# Patient Record
Sex: Male | Born: 1985 | Race: White | Hispanic: No | Marital: Single | Smoking: Never smoker
Health system: Southern US, Community
[De-identification: ages and names within clinical notes are randomized; demographics above are authoritative.]

## PROBLEM LIST (undated history)

## (undated) DIAGNOSIS — K08409 Partial loss of teeth, unspecified cause, unspecified class: Secondary | ICD-10-CM

## (undated) HISTORY — PX: FRACTURE SURGERY: SHX138

## (undated) HISTORY — PX: HERNIA REPAIR: SHX51

---

## 2016-10-02 ENCOUNTER — Ambulatory Visit: Payer: Self-pay | Admitting: General Surgery

## 2016-10-04 ENCOUNTER — Ambulatory Visit: Payer: Self-pay | Admitting: General Surgery

## 2016-10-04 NOTE — H&P (Signed)
History of Present Illness Axel Filler MD; 10/01/2016 11:17 AM) The patient is a 31 year old male who presents with an inguinal hernia. Referred by: Dr. Tarri Fuller Chief Complaint: Right inguinal pain  Patient is a 31 year old male who comes in with a several month history of right inguinal pain. He states that he has sporadic pain that can be sharp at times. He does state that he is very active. He does state that at times when he is exerting himself he does have some pain to the right inguinal area. He has not noticed any bulge. He does do some heavy lifting at times. He's had no signs or symptoms of incarceration or strangulation. He states his most recent episode he is lying down did not help with his discomfort.    Past Surgical History (Tanisha A. Manson Passey, RMA; 10/01/2016 11:00 AM) No pertinent past surgical history   Diagnostic Studies History (Tanisha A. Manson Passey, RMA; 10/01/2016 11:00 AM) Colonoscopy  never  Allergies (Tanisha A. Manson Passey, RMA; 10/01/2016 11:01 AM) No Known Drug Allergies 10/01/2016 Allergies Reconciled   Medication History (Tanisha A. Manson Passey, RMA; 10/01/2016 11:01 AM) Amphetamine-Dextroamphetamine (  Tablet, Oral) Active. Medications Reconciled  Social History (Tanisha A. Manson Passey, RMA; 10/01/2016 11:00 AM) Caffeine use  Carbonated beverages, Tea. No drug use     Review of Systems Axel Filler MD; 10/01/2016 11:16 AM) General Not Present- Appetite Loss, Chills, Fatigue, Fever, Night Sweats, Weight Gain and Weight Loss. Skin Not Present- Change in Wart/Mole, Dryness, Hives, Jaundice, New Lesions, Non-Healing Wounds, Rash and Ulcer. Respiratory Not Present- Bloody sputum, Chronic Cough, Difficulty Breathing, Snoring and Wheezing. Breast Not Present- Breast Mass, Breast Pain, Nipple Discharge and Skin Changes. Cardiovascular Not Present- Chest Pain, Difficulty Breathing Lying Down, Leg Cramps, Palpitations, Rapid Heart Rate, Shortness of Breath  and Swelling of Extremities. Gastrointestinal Not Present- Abdominal Pain, Bloating, Bloody Stool, Change in Bowel Habits, Chronic diarrhea, Constipation, Difficulty Swallowing, Excessive gas, Gets full quickly at meals, Hemorrhoids, Indigestion, Nausea, Rectal Pain and Vomiting. Male Genitourinary Not Present- Blood in Urine, Change in Urinary Stream, Frequency, Impotence, Nocturia, Painful Urination, Urgency and Urine Leakage. Neurological Not Present- Decreased Memory, Fainting, Headaches, Numbness, Seizures, Tingling, Tremor, Trouble walking and Weakness. Psychiatric Not Present- Anxiety, Bipolar, Change in Sleep Pattern, Depression, Fearful and Frequent crying. Endocrine Not Present- Cold Intolerance, Excessive Hunger, Hair Changes, Heat Intolerance, Hot flashes and New Diabetes. Hematology Not Present- Blood Thinners, Easy Bruising, Excessive bleeding, Gland problems, HIV and Persistent Infections. All other systems negative  Vitals (Tanisha A. Brown RMA; 10/01/2016 11:01 AM) 10/01/2016 11:00 AM Weight: 186.8 lb Height: 71in Body Surface Area: 2.05 m Body Mass Index: 26.05 kg/m  Temp.: 97.33F  Pulse: 64 (Regular)  P.OX: 92% (Room air) BP: 122/76 (Sitting, Left Arm, Standard)       Physical Exam Axel Filler MD; 10/01/2016 11:17 AM) The physical exam findings are as follows: Note:Constitutional: No acute distress, conversant, appears stated age  Eyes: Anicteric sclerae, moist conjunctiva, no lid lag  Neck: No thyromegaly, trachea midline, no cervical lymphadenopathy  Lungs: Clear to auscultation biilaterally, normal respiratory effot  Cardiovascular: regular rate & rhythm, no murmurs, no peripheal edema, pedal pulses 2+  GI: Soft, no masses or hepatosplenomegaly, non-tender to palpation  MSK: Normal gait, no clubbing cyanosis, edema  Skin: No rashes, palpation reveals normal skin turgor  Psychiatric: Appropriate judgment and insight, oriented to person,  place, and time  Abdomen Inspection Hernias - Inguinal hernia - Right - Reducible.    Assessment & Plan Jed Limerick Derrell Lolling  MD; 10/01/2016 11:17 AM) RIGHT INGUINAL HERNIA (K40.90) Impression: 31 year old male with a right inguinal hernia  1. The patient will like to proceed to the operating room for laparoscopic right inguinal hernia repair with mesh.  2. I discussed with the patient the signs and symptoms of incarceration and strangulation and the need to proceed to the ER should they occur.  3. I discussed with the patient the risks and benefits of the procedure to include but not limited to: Infection, bleeding, damage to surrounding structures, possible need for further surgery, possible nerve pain, and possible recurrence. The patient was understanding and wishes to proceed.

## 2016-10-22 NOTE — Patient Instructions (Addendum)
Gerard Cantara  10/22/2016   Your procedure is scheduled on: 11-02-16  Report to [facility] Main  Entrance Take [place]  elevators to 3rd floor to  Short Stay Center at 10:45 AM.   Call this number if you have problems the morning of surgery [phone]   Remember: ONLY 1 PERSON MAY GO WITH YOU TO SHORT STAY TO GET  READY MORNING OF YOUR SURGERY.  Do not eat food or drink liquids :After Midnight.     Take these medicines the morning of surgery with A SIP OF WATER: None                                You may not have any metal on your body including hair pins and              piercings  Do not wear jewelry, lotions, powders or perfumes, deodorant             Men may shave face and neck.   Do not bring valuables to the hospital. [facility] IS NOT             RESPONSIBLE   FOR VALUABLES.  Contacts, dentures or bridgework may not be worn into surgery.     Patients discharged the day of surgery will not be allowed to drive home.  Name and phone number of your driver: Lequita Halt [phone]               Please read over the following fact sheets you were given: _____________________________________________________________________             [facility] - Preparing for Surgery Before surgery, you can play an important role.  Because skin is not sterile, your skin needs to be as free of germs as possible.  You can reduce the number of germs on your skin by washing with CHG (chlorahexidine gluconate) soap before surgery.  CHG is an antiseptic cleaner which kills germs and bonds with the skin to continue killing germs even after washing. Please DO NOT use if you have an allergy to CHG or antibacterial soaps.  If your skin becomes reddened/irritated stop using the CHG and inform your nurse when you arrive at Short Stay. Do not shave (including legs and underarms) for at least 48 hours prior to the first CHG shower.  You may shave your face/neck. Please follow these  instructions carefully:  1.  Shower with CHG Soap the night before surgery and the  morning of Surgery.  2.  If you choose to wash your hair, wash your hair first as usual with your  normal  shampoo.  3.  After you shampoo, rinse your hair and body thoroughly to remove the  shampoo.                           4.  Use CHG as you would any other liquid soap.  You can apply chg directly  to the skin and wash                       Gently with a scrungie or clean washcloth.  5.  Apply the CHG Soap to your body ONLY FROM THE NECK DOWN.   Do not use on face/ open  Wound or open sores. Avoid contact with eyes, ears mouth and genitals (private parts).                       Wash face,  Genitals (private parts) with your normal soap.             6.  Wash thoroughly, paying special attention to the area where your surgery  will be performed.  7.  Thoroughly rinse your body with warm water from the neck down.  8.  DO NOT shower/wash with your normal soap after using and rinsing off  the CHG Soap.                9.  Pat yourself dry with a clean towel.            10.  Wear clean pajamas.            11.  Place clean sheets on your bed the night of your first shower and do not  sleep with pets. Day of Surgery : Do not apply any lotions/deodorants the morning of surgery.  Please wear clean clothes to the hospital/surgery center.  FAILURE TO FOLLOW THESE INSTRUCTIONS MAY RESULT IN THE CANCELLATION OF YOUR SURGERY PATIENT SIGNATURE_________________________________  NURSE SIGNATURE__________________________________  ________________________________________________________________________

## 2016-10-23 ENCOUNTER — Encounter (HOSPITAL_COMMUNITY)
Admission: RE | Admit: 2016-10-23 | Discharge: 2016-10-23 | Disposition: A | Discharge status: Home / Self Care | Payer: PRIVATE HEALTH INSURANCE | Source: Ambulatory Visit | Attending: General Surgery | Admitting: General Surgery

## 2016-10-23 ENCOUNTER — Encounter (INDEPENDENT_AMBULATORY_CARE_PROVIDER_SITE_OTHER): Payer: Self-pay

## 2016-10-23 ENCOUNTER — Encounter (HOSPITAL_COMMUNITY): Payer: Self-pay

## 2016-10-23 DIAGNOSIS — K4090 Unilateral inguinal hernia, without obstruction or gangrene, not specified as recurrent: Secondary | ICD-10-CM | POA: Diagnosis not present

## 2016-10-23 DIAGNOSIS — Z01818 Encounter for other preprocedural examination: Secondary | ICD-10-CM | POA: Insufficient documentation

## 2016-10-23 HISTORY — DX: Partial loss of teeth, unspecified cause, unspecified class: K08.409

## 2016-10-23 LAB — CBC
HCT: 41.4 % (ref 39.0–52.0)
HEMOGLOBIN: 14.3 g/dL (ref 13.0–17.0)
MCH: 28.8 pg (ref 26.0–34.0)
MCHC: 34.5 g/dL (ref 30.0–36.0)
MCV: 83.5 fL (ref 78.0–100.0)
PLATELETS: 207 10*3/uL (ref 150–400)
RBC: 4.96 MIL/uL (ref 4.22–5.81)
RDW: 12.6 % (ref 11.5–15.5)
WBC: 5.7 10*3/uL (ref 4.0–10.5)

## 2016-11-02 ENCOUNTER — Ambulatory Visit (HOSPITAL_COMMUNITY): Payer: PRIVATE HEALTH INSURANCE | Admitting: Anesthesiology

## 2016-11-02 ENCOUNTER — Ambulatory Visit (HOSPITAL_COMMUNITY)
Admission: RE | Admit: 2016-11-02 | Discharge: 2016-11-02 | Disposition: A | Discharge status: Home / Self Care | Payer: PRIVATE HEALTH INSURANCE | Source: Ambulatory Visit | Attending: General Surgery | Admitting: General Surgery

## 2016-11-02 ENCOUNTER — Encounter (HOSPITAL_COMMUNITY): Payer: Self-pay | Admitting: *Deleted

## 2016-11-02 ENCOUNTER — Encounter (HOSPITAL_COMMUNITY)
Admission: RE | Disposition: A | Discharge status: Home / Self Care | Payer: Self-pay | Source: Ambulatory Visit | Attending: General Surgery

## 2016-11-02 DIAGNOSIS — K4090 Unilateral inguinal hernia, without obstruction or gangrene, not specified as recurrent: Secondary | ICD-10-CM | POA: Insufficient documentation

## 2016-11-02 HISTORY — PX: INGUINAL HERNIA REPAIR: SHX194

## 2016-11-02 SURGERY — REPAIR, HERNIA, INGUINAL, LAPAROSCOPIC
Anesthesia: General | Site: Groin | Laterality: Right

## 2016-11-02 MED ORDER — SODIUM CHLORIDE 0.9% FLUSH: 3.0000 mL | Freq: Two times a day (BID) | INTRAVENOUS | Status: DC

## 2016-11-02 MED ORDER — HYDROMORPHONE HCL 1 MG/ML IJ SOLN
INTRAMUSCULAR | Status: DC | PRN
  Administered 2016-11-02 (×2): 1 mg via INTRAVENOUS

## 2016-11-02 MED ORDER — LACTATED RINGERS IV SOLN
INTRAVENOUS | Status: DC
  Administered 2016-11-02 (×2): via INTRAVENOUS

## 2016-11-02 MED ORDER — PROPOFOL 10 MG/ML IV BOLUS
INTRAVENOUS | Status: AC
  Filled 2016-11-02: qty 20

## 2016-11-02 MED ORDER — SODIUM CHLORIDE 0.9 % IV SOLN: 250.0000 mL | INTRAVENOUS | Status: DC | PRN

## 2016-11-02 MED ORDER — OXYCODONE HCL 5 MG PO TABS
5.0000 mg | ORAL_TABLET | ORAL | Status: DC | PRN
  Administered 2016-11-02 (×2): 5 mg via ORAL
  Filled 2016-11-02 (×2): qty 1

## 2016-11-02 MED ORDER — LIDOCAINE 2% (20 MG/ML) 5 ML SYRINGE
INTRAMUSCULAR | Status: AC
  Filled 2016-11-02: qty 5

## 2016-11-02 MED ORDER — ONDANSETRON HCL 4 MG/2ML IJ SOLN
INTRAMUSCULAR | Status: DC | PRN
  Administered 2016-11-02: 4 mg via INTRAVENOUS

## 2016-11-02 MED ORDER — LIDOCAINE 2% (20 MG/ML) 5 ML SYRINGE
INTRAMUSCULAR | Status: DC | PRN
  Administered 2016-11-02: 100 mg via INTRAVENOUS

## 2016-11-02 MED ORDER — FENTANYL CITRATE (PF) 100 MCG/2ML IJ SOLN: 25.0000 ug | INTRAMUSCULAR | Status: DC | PRN

## 2016-11-02 MED ORDER — BUPIVACAINE-EPINEPHRINE 0.25% -1:200000 IJ SOLN
INTRAMUSCULAR | Status: DC | PRN
  Administered 2016-11-02: 7 mL

## 2016-11-02 MED ORDER — ONDANSETRON HCL 4 MG/2ML IJ SOLN
INTRAMUSCULAR | Status: AC
Start: 2016-11-02 — End: 2016-11-02
  Filled 2016-11-02: qty 2

## 2016-11-02 MED ORDER — BUPIVACAINE-EPINEPHRINE (PF) 0.25% -1:200000 IJ SOLN
INTRAMUSCULAR | Status: AC
  Filled 2016-11-02: qty 30

## 2016-11-02 MED ORDER — MORPHINE SULFATE (PF) 4 MG/ML IV SOLN: 2.0000 mg | INTRAVENOUS | Status: DC | PRN

## 2016-11-02 MED ORDER — FENTANYL CITRATE (PF) 100 MCG/2ML IJ SOLN
INTRAMUSCULAR | Status: DC | PRN
  Administered 2016-11-02 (×3): 50 ug via INTRAVENOUS

## 2016-11-02 MED ORDER — ROCURONIUM BROMIDE 50 MG/5ML IV SOSY
PREFILLED_SYRINGE | INTRAVENOUS | Status: AC
  Filled 2016-11-02: qty 5

## 2016-11-02 MED ORDER — SUGAMMADEX SODIUM 500 MG/5ML IV SOLN
INTRAVENOUS | Status: AC
  Filled 2016-11-02: qty 5

## 2016-11-02 MED ORDER — PROPOFOL 10 MG/ML IV BOLUS
INTRAVENOUS | Status: AC
Start: 2016-11-02 — End: 2016-11-02
  Filled 2016-11-02: qty 20

## 2016-11-02 MED ORDER — PROPOFOL 10 MG/ML IV BOLUS
INTRAVENOUS | Status: DC | PRN
  Administered 2016-11-02: 300 mg via INTRAVENOUS

## 2016-11-02 MED ORDER — CHLORHEXIDINE GLUCONATE CLOTH 2 % EX PADS: 6.0000 | MEDICATED_PAD | Freq: Once | CUTANEOUS | Status: DC

## 2016-11-02 MED ORDER — ROCURONIUM BROMIDE 50 MG/5ML IV SOSY
PREFILLED_SYRINGE | INTRAVENOUS | Status: DC | PRN
  Administered 2016-11-02: 50 mg via INTRAVENOUS
  Administered 2016-11-02: 20 mg via INTRAVENOUS

## 2016-11-02 MED ORDER — PROMETHAZINE HCL 25 MG/ML IJ SOLN: 6.2500 mg | INTRAMUSCULAR | Status: DC | PRN

## 2016-11-02 MED ORDER — 0.9 % SODIUM CHLORIDE (POUR BTL) OPTIME
TOPICAL | Status: DC | PRN
  Administered 2016-11-02: 1000 mL

## 2016-11-02 MED ORDER — FENTANYL CITRATE (PF) 250 MCG/5ML IJ SOLN
INTRAMUSCULAR | Status: AC
  Filled 2016-11-02: qty 5

## 2016-11-02 MED ORDER — ACETAMINOPHEN 325 MG PO TABS: 650.0000 mg | ORAL_TABLET | ORAL | Status: DC | PRN

## 2016-11-02 MED ORDER — DEXAMETHASONE SODIUM PHOSPHATE 10 MG/ML IJ SOLN
INTRAMUSCULAR | Status: AC
  Filled 2016-11-02: qty 1

## 2016-11-02 MED ORDER — MIDAZOLAM HCL 2 MG/2ML IJ SOLN
INTRAMUSCULAR | Status: AC
  Filled 2016-11-02: qty 2

## 2016-11-02 MED ORDER — SODIUM CHLORIDE 0.9% FLUSH: 3.0000 mL | INTRAVENOUS | Status: DC | PRN

## 2016-11-02 MED ORDER — HYDROMORPHONE HCL 2 MG/ML IJ SOLN
INTRAMUSCULAR | Status: AC
  Filled 2016-11-02: qty 1

## 2016-11-02 MED ORDER — ACETAMINOPHEN 650 MG RE SUPP
650.0000 mg | RECTAL | Status: DC | PRN
  Filled 2016-11-02: qty 1

## 2016-11-02 MED ORDER — DEXAMETHASONE SODIUM PHOSPHATE 4 MG/ML IJ SOLN
INTRAMUSCULAR | Status: DC | PRN
  Administered 2016-11-02: 10 mg via INTRAVENOUS

## 2016-11-02 MED ORDER — MIDAZOLAM HCL 5 MG/5ML IJ SOLN
INTRAMUSCULAR | Status: DC | PRN
  Administered 2016-11-02: 2 mg via INTRAVENOUS

## 2016-11-02 MED ORDER — SUGAMMADEX SODIUM 200 MG/2ML IV SOLN
INTRAVENOUS | Status: DC | PRN
  Administered 2016-11-02: 200 mg via INTRAVENOUS

## 2016-11-02 MED ORDER — CEFAZOLIN SODIUM-DEXTROSE 2-4 GM/100ML-% IV SOLN
2.0000 g | INTRAVENOUS | Status: AC
  Administered 2016-11-02: 2 g via INTRAVENOUS
  Filled 2016-11-02: qty 100

## 2016-11-02 MED ORDER — OXYCODONE-ACETAMINOPHEN 5-325 MG PO TABS: 1.0000 | ORAL_TABLET | Freq: Four times a day (QID) | ORAL | 0 refills | Status: AC | PRN

## 2016-11-02 SURGICAL SUPPLY — 33 items
APPLIER CLIP 5 13 M/L LIGAMAX5 (MISCELLANEOUS)
BENZOIN TINCTURE PRP APPL 2/3 (GAUZE/BANDAGES/DRESSINGS) ×3 IMPLANT
CABLE HIGH FREQUENCY MONO STRZ (ELECTRODE) ×3 IMPLANT
CHLORAPREP W/TINT 26ML (MISCELLANEOUS) ×3 IMPLANT
CLIP APPLIE 5 13 M/L LIGAMAX5 (MISCELLANEOUS) IMPLANT
CLOSURE WOUND 1/2 X4 (GAUZE/BANDAGES/DRESSINGS) ×1
DECANTER SPIKE VIAL GLASS SM (MISCELLANEOUS) ×3 IMPLANT
DERMABOND ADVANCED (GAUZE/BANDAGES/DRESSINGS) ×2
DERMABOND ADVANCED .7 DNX12 (GAUZE/BANDAGES/DRESSINGS) ×1 IMPLANT
ELECT REM PT RETURN 15FT ADLT (MISCELLANEOUS) ×3 IMPLANT
ENDOLOOP SUT PDS II  0 18 (SUTURE)
ENDOLOOP SUT PDS II 0 18 (SUTURE) IMPLANT
GAUZE SPONGE 2X2 8PLY STRL LF (GAUZE/BANDAGES/DRESSINGS) ×1 IMPLANT
GLOVE BIO SURGEON STRL SZ7.5 (GLOVE) ×3 IMPLANT
GOWN STRL REUS W/TWL XL LVL3 (GOWN DISPOSABLE) ×6 IMPLANT
KIT BASIN OR (CUSTOM PROCEDURE TRAY) ×3 IMPLANT
MESH 3DMAX 5X7 RT XLRG (Mesh General) ×3 IMPLANT
NEEDLE INSUFFLATION 14GA 120MM (NEEDLE) IMPLANT
POSITIONER SURGICAL ARM (MISCELLANEOUS) ×3 IMPLANT
RELOAD STAPLE HERNIA 4.0 BLUE (INSTRUMENTS) ×3 IMPLANT
RELOAD STAPLE HERNIA 4.8 BLK (STAPLE) IMPLANT
SCISSORS LAP 5X35 DISP (ENDOMECHANICALS) ×3 IMPLANT
SET IRRIG TUBING LAPAROSCOPIC (IRRIGATION / IRRIGATOR) ×3 IMPLANT
SPONGE GAUZE 2X2 STER 10/PKG (GAUZE/BANDAGES/DRESSINGS) ×2
STAPLER HERNIA 12 8.5 360D (INSTRUMENTS) ×3 IMPLANT
STRIP CLOSURE SKIN 1/2X4 (GAUZE/BANDAGES/DRESSINGS) ×2 IMPLANT
SUT MNCRL AB 4-0 PS2 18 (SUTURE) ×3 IMPLANT
TOWEL OR 17X26 10 PK STRL BLUE (TOWEL DISPOSABLE) ×3 IMPLANT
TOWEL OR NON WOVEN STRL DISP B (DISPOSABLE) ×3 IMPLANT
TRAY LAPAROSCOPIC (CUSTOM PROCEDURE TRAY) ×3 IMPLANT
TROCAR CANNULA W/PORT DUAL 5MM (MISCELLANEOUS) ×3 IMPLANT
TROCAR XCEL 12X100 BLDLESS (ENDOMECHANICALS) ×3 IMPLANT
TUBING INSUF HEATED (TUBING) ×3 IMPLANT

## 2016-11-02 NOTE — Op Note (Signed)
11/02/2016  1:32 PM  PATIENT:  Justin Cobb  31 y.o. male  PRE-OPERATIVE DIAGNOSIS:  RIGHT INGUINAL HERNIA  POST-OPERATIVE DIAGNOSIS:  RIGHT INDIRECT INGUINAL HERNIA  PROCEDURE:  Procedure(s): LAPAROSCOPIC RIGHT INGUINAL HERNIA WITH MESH (Right)  SURGEON:  Surgeon(s) and Role:    * Axel Filleramirez, Red Mandt, MD - Primary  ANESTHESIA:   local and general  EBL:  minimal   BLOOD ADMINISTERED:none  DRAINS: none   LOCAL MEDICATIONS USED:  BUPIVICAINE   SPECIMEN:  No Specimen  DISPOSITION OF SPECIMEN:  N/A  COUNTS:  YES  TOURNIQUET:  * No tourniquets in log *  DICTATION: .Dragon Dictation    Counts: reported as correct x 2  Findings:  The patient had a small right INdirect hernia  Indications for procedure:  The patient is a [age] year old male with a RIGHT hernia for several months. Patient complained of symptomatology to his RIGHT inguinal area. The patient was taken back for elective inguinal hernia repair.  Details of the procedure: The patient was taken back to the operating room. The patient was placed in supine position with bilateral SCDs in place.  The patient was prepped and draped in the usual sterile fashion.  After appropriate anitbiotics were confirmed, a time-out was confirmed and all facts were verified.  0.25% Marcaine was used to infiltrate the umbilical area. A 11-blade was used to cut down the skin and blunt dissection was used to get the anterior fashion.  The anterior fascia was incised approximately 1 cm and the muscles were retracted laterally. Blunt dissection was then used to create a space in the preperitoneal area. At this time a 10 mm camera was then introduced into the space and advanced the pubic tubercle and a 12 mm trocar was placed over this and insufflation was started.  At this time and space was created from medial to laterally the preperitoneal space.  Cooper's ligament was initially cleaned off.  The hernia sac was identified in the indirect space.  Dissection of the hernia sac was undertaken the vas deferens was identified and protected in all parts of the case.  There was a small tear into the hernia sac. A PDS endoloop was used to ligate the tear.  Once the hernia sac was taken down to approximately the umbilicus a Bard 3D Max mesh, size: Barney DrainXLarge, was  introduced into the preperitoneal space.  The mesh was brought over to cover the direct and indirect hernia spaces.  This was anchored into place and secured to Cooper's ligament with 4.490mm staples from a Coviden hernia stapler. It was anchored to the anterior abdominal wall with 4.8 mm staples. The hernia sac was seen lying posterior to the mesh. There was no staples placed laterally. The insufflation was evacuated and the peritoneum was seen posterior to the mesh. The trochars were removed. The anterior fascia was reapproximated using #1 Vicryl on a UR- 6.  Intra-abdominal air was evacuated and the Veress needle removed. The skin was reapproximated using 4-0 Monocryl subcuticular fashion the patient was awakened from general anesthesia and taken to recovery in stable condition.\   PLAN OF CARE: Discharge to home after PACU  PATIENT DISPOSITION:  PACU - hemodynamically stable.   Delay start of Pharmacological VTE agent (>24hrs) due to surgical blood loss or risk of bleeding: not applicable

## 2016-11-02 NOTE — H&P (View-Only) (Signed)
History of Present Illness Axel Filler(Elyshia Kumagai MD; 10/01/2016 11:17 AM) The patient is a 31 year old male who presents with an inguinal hernia. Referred by: Dr. Tarri Fullerichard Escajeda Chief Complaint: Right inguinal pain  Patient is a 31 year old male who comes in with a several month history of right inguinal pain. He states that he has sporadic pain that can be sharp at times. He does state that he is very active. He does state that at times when he is exerting himself he does have some pain to the right inguinal area. He has not noticed any bulge. He does do some heavy lifting at times. He's had no signs or symptoms of incarceration or strangulation. He states his most recent episode he is lying down did not help with his discomfort.    Past Surgical History (Tanisha A. Manson PasseyBrown, RMA; 10/01/2016 11:00 AM) No pertinent past surgical history   Diagnostic Studies History (Tanisha A. Manson PasseyBrown, RMA; 10/01/2016 11:00 AM) Colonoscopy  never  Allergies (Tanisha A. Manson PasseyBrown, RMA; 10/01/2016 11:01 AM) No Known Drug Allergies 10/01/2016 Allergies Reconciled   Medication History (Tanisha A. Manson PasseyBrown, RMA; 10/01/2016 11:01 AM) Amphetamine-Dextroamphetamine (10MG  Tablet, Oral) Active. Medications Reconciled  Social History (Tanisha A. Manson PasseyBrown, RMA; 10/01/2016 11:00 AM) Caffeine use  Carbonated beverages, Tea. No drug use     Review of Systems Axel Filler(Refael Fulop MD; 10/01/2016 11:16 AM) General Not Present- Appetite Loss, Chills, Fatigue, Fever, Night Sweats, Weight Gain and Weight Loss. Skin Not Present- Change in Wart/Mole, Dryness, Hives, Jaundice, New Lesions, Non-Healing Wounds, Rash and Ulcer. Respiratory Not Present- Bloody sputum, Chronic Cough, Difficulty Breathing, Snoring and Wheezing. Breast Not Present- Breast Mass, Breast Pain, Nipple Discharge and Skin Changes. Cardiovascular Not Present- Chest Pain, Difficulty Breathing Lying Down, Leg Cramps, Palpitations, Rapid Heart Rate, Shortness of Breath  and Swelling of Extremities. Gastrointestinal Not Present- Abdominal Pain, Bloating, Bloody Stool, Change in Bowel Habits, Chronic diarrhea, Constipation, Difficulty Swallowing, Excessive gas, Gets full quickly at meals, Hemorrhoids, Indigestion, Nausea, Rectal Pain and Vomiting. Male Genitourinary Not Present- Blood in Urine, Change in Urinary Stream, Frequency, Impotence, Nocturia, Painful Urination, Urgency and Urine Leakage. Neurological Not Present- Decreased Memory, Fainting, Headaches, Numbness, Seizures, Tingling, Tremor, Trouble walking and Weakness. Psychiatric Not Present- Anxiety, Bipolar, Change in Sleep Pattern, Depression, Fearful and Frequent crying. Endocrine Not Present- Cold Intolerance, Excessive Hunger, Hair Changes, Heat Intolerance, Hot flashes and New Diabetes. Hematology Not Present- Blood Thinners, Easy Bruising, Excessive bleeding, Gland problems, HIV and Persistent Infections. All other systems negative  Vitals (Tanisha A. Brown RMA; 10/01/2016 11:01 AM) 10/01/2016 11:00 AM Weight: 186.8 lb Height: 71in Body Surface Area: 2.05 m Body Mass Index: 26.05 kg/m  Temp.: 97.41F  Pulse: 64 (Regular)  P.OX: 92% (Room air) BP: 122/76 (Sitting, Left Arm, Standard)       Physical Exam Axel Filler(Fallen Crisostomo MD; 10/01/2016 11:17 AM) The physical exam findings are as follows: Note:Constitutional: No acute distress, conversant, appears stated age  Eyes: Anicteric sclerae, moist conjunctiva, no lid lag  Neck: No thyromegaly, trachea midline, no cervical lymphadenopathy  Lungs: Clear to auscultation biilaterally, normal respiratory effot  Cardiovascular: regular rate & rhythm, no murmurs, no peripheal edema, pedal pulses 2+  GI: Soft, no masses or hepatosplenomegaly, non-tender to palpation  MSK: Normal gait, no clubbing cyanosis, edema  Skin: No rashes, palpation reveals normal skin turgor  Psychiatric: Appropriate judgment and insight, oriented to person,  place, and time  Abdomen Inspection Hernias - Inguinal hernia - Right - Reducible.    Assessment & Plan Jed Limerick(Camerin Jimenez Derrell LollingRamirez  MD; 10/01/2016 11:17 AM) RIGHT INGUINAL HERNIA (K40.90) Impression: 31-year-old male with a right inguinal hernia  1. The patient will like to proceed to the operating room for laparoscopic right inguinal hernia repair with mesh.  2. I discussed with the patient the signs and symptoms of incarceration and strangulation and the need to proceed to the ER should they occur.  3. I discussed with the patient the risks and benefits of the procedure to include but not limited to: Infection, bleeding, damage to surrounding structures, possible need for further surgery, possible nerve pain, and possible recurrence. The patient was understanding and wishes to proceed. 

## 2016-11-02 NOTE — Discharge Instructions (Signed)
[facility] _______[facility] °INGUINAL HERNIA REPAIR: POST OP INSTRUCTIONS ° °Always review your discharge instruction sheet given to you by the facility where your surgery was performed. °IF YOU HAVE DISABILITY OR FAMILY LEAVE FORMS, YOU MUST BRING THEM TO THE OFFICE FOR PROCESSING.   °DO NOT GIVE THEM TO YOUR DOCTOR. ° °1. A  prescription for pain medication may be given to you upon discharge.  Take your pain medication as prescribed, if needed.  If narcotic pain medicine is not needed, then you may take acetaminophen (Tylenol) or ibuprofen (Advil) as needed. °2. Take your usually prescribed medications unless otherwise directed. °If you need a refill on your pain medication, please contact your pharmacy.  They will contact our office to request authorization. Prescriptions will not be filled after 5 pm or on week-ends. °3. You should follow a light diet the first 24 hours after arrival home, such as soup and crackers, etc.  Be sure to include lots of fluids daily.  Resume your normal diet the day after surgery. °4.Most patients will experience some swelling and bruising around the umbilicus or in the groin and scrotum.  Ice packs and reclining will help.  Swelling and bruising can take several days to resolve.  °6. It is common to experience some constipation if taking pain medication after surgery.  Increasing fluid intake and taking a stool softener (such as Colace) will usually help or prevent this problem from occurring.  A mild laxative (Milk of Magnesia or Miralax) should be taken according to package directions if there are no bowel movements after 48 hours. °7. Unless discharge instructions indicate otherwise, you may remove your bandages 24-48 hours after surgery, and you may shower at that time.  You may have steri-strips (small skin tapes) in place directly over the incision.  These strips should be left on the skin for 7-10 days.  If your surgeon used skin glue on the incision, you may  shower in 24 hours.  The glue will flake off over the next 2-3 weeks.  Any sutures or staples will be removed at the office during your follow-up visit. °8. ACTIVITIES:  You may resume regular (light) daily activities beginning the next day--such as daily self-care, walking, climbing stairs--gradually increasing activities as tolerated.  You may have sexual intercourse when it is comfortable.  Refrain from any heavy lifting or straining until approved by your doctor. ° °a.You may drive when you are no longer taking prescription pain medication, you can comfortably wear a seatbelt, and you can safely maneuver your car and apply brakes. °b.RETURN TO WORK:   °_____________________________________________ ° °9.You should see your doctor in the office for a follow-up appointment approximately 2-3 weeks after your surgery.  Make sure that you call for this appointment within a day or two after you arrive home to insure a convenient appointment time. °10.OTHER INSTRUCTIONS: _________________________ °   _____________________________________ ° °WHEN TO CALL YOUR DOCTOR: °1. Fever over 101.0 °2. Inability to urinate °3. Nausea and/or vomiting °4. Extreme swelling or bruising °5. Continued bleeding from incision. °6. Increased pain, redness, or drainage from the incision ° °The clinic staff is available to answer your questions during regular business hours.  Please don’t hesitate to call and ask to speak to one of the nurses for clinical concerns.  If you have a medical emergency, go to the nearest emergency room or call 911.  A surgeon from [facility] is always on call at the hospital ° ° °[address], [address], [place], [place]  [address] ? ° [address] [address], [place], [place]   [address] °[phone] ? [phone] ? FAX [phone] °Web site: [email or web address] ° °

## 2016-11-02 NOTE — Anesthesia Procedure Notes (Signed)
Procedure Name: Intubation Date/Time: 11/02/2016 12:47 PM Performed by: Deliah Boston Pre-anesthesia Checklist: Patient identified, Emergency Drugs available, Suction available and Patient being monitored Patient Re-evaluated:Patient Re-evaluated prior to induction Oxygen Delivery Method: Circle system utilized Preoxygenation: Pre-oxygenation with 100% oxygen Induction Type: IV induction Ventilation: Mask ventilation without difficulty Laryngoscope Size: Mac and 4 Grade View: Grade II Tube type: Oral Tube size: 7.5 mm Number of attempts: 1 Airway Equipment and Method: Stylet and Oral airway Placement Confirmation: ETT inserted through vocal cords under direct vision,  positive ETCO2 and breath sounds checked- equal and bilateral Secured at: 22 cm Tube secured with: Tape Dental Injury: Teeth and Oropharynx as per pre-operative assessment

## 2016-11-02 NOTE — Anesthesia Postprocedure Evaluation (Signed)
Anesthesia Post Note  Patient: Justin Cobb  Procedure(s) Performed: LAPAROSCOPIC RIGHT INGUINAL HERNIA WITH MESH (Right Groin)     Patient location during evaluation: PACU Anesthesia Type: General Level of consciousness: awake and alert Pain management: pain level controlled Vital Signs Assessment: post-procedure vital signs reviewed and stable Respiratory status: spontaneous breathing, nonlabored ventilation and respiratory function stable Cardiovascular status: blood pressure returned to baseline and stable Postop Assessment: no apparent nausea or vomiting Anesthetic complications: no    Last Vitals:  Vitals:   11/02/16 1506 11/02/16 1545  BP: 100/67 108/78  Pulse: (!) 43 (!) 48  Resp: 16 16  Temp: 36.8 C 36.7 C  SpO2: 100% 100%    Last Pain:  Vitals:   11/02/16 1545  TempSrc: Oral  PainSc: 4                  Cecile HearingStephen Edward Turk

## 2016-11-02 NOTE — Interval H&P Note (Signed)
History and Physical Interval Note:  11/02/2016 12:16 PM  Justin GowdaWilliam Tarlton  has presented today for surgery, with the diagnosis of RIGHT INGUINAL HERNIA  The various methods of treatment have been discussed with the patient and family. After consideration of risks, benefits and other options for treatment, the patient has consented to  Procedure(s): LAPAROSCOPIC RIGHT INGUINAL HERNIA WITH MESH (Right) as a surgical intervention .  The patient's history has been reviewed, patient examined, no change in status, stable for surgery.  I have reviewed the patient's chart and labs.  Questions were answered to the patient's satisfaction.     Marigene Ehlersamirez Jr., Jed LimerickArmando

## 2016-11-02 NOTE — Anesthesia Preprocedure Evaluation (Addendum)
Anesthesia Evaluation  Patient identified by MRN, date of birth, ID band Patient awake    Reviewed: Allergy & Precautions, NPO status , Patient's Chart, lab work & pertinent test results  Airway Mallampati: I  TM Distance: >3 FB Neck ROM: Full    Dental  (+) Dental Advisory Given   Pulmonary neg pulmonary ROS,    Pulmonary exam normal breath sounds clear to auscultation       Cardiovascular negative cardio ROS Normal cardiovascular exam Rhythm:Regular Rate:Normal     Neuro/Psych negative neurological ROS  negative psych ROS   GI/Hepatic negative GI ROS, Neg liver ROS,   Endo/Other  negative endocrine ROS  Renal/GU negative Renal ROS  negative genitourinary   Musculoskeletal negative musculoskeletal ROS (+)   Abdominal   Peds  Hematology negative hematology ROS (+)   Anesthesia Other Findings   Reproductive/Obstetrics                            Anesthesia Physical Anesthesia Plan  ASA: I  Anesthesia Plan: General   Post-op Pain Management:    Induction: Intravenous  PONV Risk Score and Plan: 4 or greater and Ondansetron, Dexamethasone, Midazolam, Scopolamine patch - Pre-op and Treatment may vary due to age or medical condition  Airway Management Planned: Oral ETT  Additional Equipment: None  Intra-op Plan:   Post-operative Plan: Extubation in OR  Informed Consent: I have reviewed the patients History and Physical, chart, labs and discussed the procedure including the risks, benefits and alternatives for the proposed anesthesia with the patient or authorized representative who has indicated his/her understanding and acceptance.   Dental advisory given  Plan Discussed with: CRNA  Anesthesia Plan Comments:        Anesthesia Quick Evaluation

## 2016-11-02 NOTE — Transfer of Care (Signed)
Immediate Anesthesia Transfer of Care Note  Patient: Justin Cobb  Procedure(s) Performed: Procedure(s): LAPAROSCOPIC RIGHT INGUINAL HERNIA WITH MESH (Right)  Patient Location: PACU  Anesthesia Type:General  Level of Consciousness: Patient easily awoken, sedated, comfortable, cooperative, following commands, responds to stimulation.   Airway & Oxygen Therapy: Patient spontaneously breathing, ventilating well, oxygen via simple oxygen mask.  Post-op Assessment: Report given to PACU RN, vital signs reviewed and stable, moving all extremities.   Post vital signs: Reviewed and stable.  Complications: No apparent anesthesia complications  Last Vitals:  Vitals:   11/02/16 1011 11/02/16 1347  BP:  127/87  Pulse: 69 67  Resp:  10  Temp:  36.6 C  SpO2: 100% 100%    Last Pain:  Vitals:   11/02/16 1347  PainSc: 0-No pain      Patients Stated Pain Goal: 4 (11/02/16 1036)  Complications: No apparent anesthesia complications

## 2019-06-09 ENCOUNTER — Other Ambulatory Visit: Payer: Self-pay | Admitting: General Surgery

## 2019-06-09 DIAGNOSIS — Z9889 Other specified postprocedural states: Secondary | ICD-10-CM

## 2019-06-24 ENCOUNTER — Other Ambulatory Visit: Payer: Self-pay

## 2019-06-24 ENCOUNTER — Ambulatory Visit
Admission: RE | Admit: 2019-06-24 | Discharge: 2019-06-24 | Disposition: A | Discharge status: Home / Self Care | Payer: PRIVATE HEALTH INSURANCE | Source: Ambulatory Visit | Attending: General Surgery | Admitting: General Surgery

## 2019-06-24 DIAGNOSIS — Z9889 Other specified postprocedural states: Secondary | ICD-10-CM

## 2019-06-30 ENCOUNTER — Other Ambulatory Visit: Payer: Self-pay

## 2019-06-30 ENCOUNTER — Ambulatory Visit: Payer: Self-pay

## 2019-06-30 ENCOUNTER — Encounter: Payer: Self-pay | Admitting: Orthopaedic Surgery

## 2019-06-30 ENCOUNTER — Ambulatory Visit (INDEPENDENT_AMBULATORY_CARE_PROVIDER_SITE_OTHER): Payer: PRIVATE HEALTH INSURANCE | Admitting: Orthopaedic Surgery

## 2019-06-30 DIAGNOSIS — M25551 Pain in right hip: Secondary | ICD-10-CM

## 2019-06-30 NOTE — Progress Notes (Signed)
Subjective: Patient is here for ultrasound-guided intra-articular right hip injection.   Possible labrum tear.  Objective:  Pain with passive flexion and IR.  Procedure: Ultrasound-guided right hip injection: After sterile prep with Betadine, injected 8 cc 1% lidocaine without epinephrine and 40 mg methylprednisolone using a 22-gauge spinal needle, passing the needle through the iliofemoral ligament into the femoral head/neck junction.  Injectate seen filling capsule.  Hypoechoic defect seen in anterior labrum suspicious for tear.

## 2019-06-30 NOTE — Addendum Note (Signed)
Addended by: Rip Harbour on: 06/30/2019 09:49 AM   Modules accepted: Orders

## 2019-06-30 NOTE — Progress Notes (Addendum)
Office Visit Note   Patient: Justin Cobb           Date of Birth: 1985-01-15           MRN: [id number] Visit Date: 06/30/2019              Requested by: [place] [address] [place],  [place] [address] PCP: Tarri Fuller, MD   Assessment & Plan: Visit Diagnoses:  1. Pain in right hip     Plan: Based on the clinical exam of his right hip as well as the signs and symptoms, he could have a labral tear.  I would like to obtain a MRI arthrogram of the right hip to rule out a labral tear.  We had a long thorough discussion about his hip and showing him the CT scan.  I explained the anatomy of the hip.  He is in agreement with his treatment plan.  We will see him back after he has a MRI arthrogram of his right hip to rule out a labral tear.  I did discuss with the patient the possibility of an intra-articular injection of a steroid under ultrasound into the right hip joint.  After he left the office he called back and said he would like to try that injection so we will set him up for that injection and then still have him come back and see me in 2 weeks.  Follow-Up Instructions: Return in about 2 weeks (around 07/14/2019).   Orders:  No orders of the defined types were placed in this encounter.  No orders of the defined types were placed in this encounter.     Procedures: No procedures performed   Clinical Data: No additional findings.   Subjective: Chief Complaint  Patient presents with  . Right Leg - Pain  The patient comes in today for evaluation treatment of chronic right hip pain.  He was sent by Dr. Derrell Lolling from general surgery.  He does have a history of a hernia repair on the right side.  He had been having hip pain prior to this for several years.  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  The pain seems to be in the groin area.  Is worse with activities but it is more problematic when he tries to  sleep on his hip on the right side at night.  If he sitting for long period time it is also painful and is in the groin area.  He denies any injury that he is aware of.  This did not resolve after the hernia surgery.  He is now diabetic.  He is a thin individual as well.  HPI  Review of Systems He currently denies any headache, chest pain, shortness of breath, fever, chills, nausea, vomiting  Objective: Vital Signs: There were no vitals taken for this visit.  Physical Exam He is alert and orient x3 and in no acute distress Ortho Exam Examination of his right and left hip shows smooth and fluid range of motion.  He does have pain in the right hip area on extremes of internal and external rotation.  He is asymptomatic on the left side. Specialty Comments:  No specialty comments available.  Imaging: No results found. I was able to independently review a CT scan that was recently of his pelvis and abdomen.  I can see the hips and his hip joints appear normal and congruent bilaterally.  PMFS History: There are no problems to  display for this patient.  Past Medical History:  Diagnosis Date  . History of tooth extraction     History reviewed. No pertinent family history.  Past Surgical History:  Procedure Laterality Date  . FRACTURE SURGERY     Wrist  . HERNIA REPAIR    . INGUINAL HERNIA REPAIR Right 11/02/2016   Procedure: LAPAROSCOPIC RIGHT INGUINAL HERNIA WITH MESH;  Surgeon: Ralene Ok, MD;  Location: [facility];  Service: General;  Laterality: Right;   Social History   Occupational History  . Not on file  Tobacco Use  . Smoking status: Never Smoker  . Smokeless tobacco: Never Used  Vaping Use  . Vaping Use: Never used  Substance and Sexual Activity  . Alcohol use: Yes    Comment: occas  . Drug use: No  . Sexual activity: Not on file

## 2019-07-15 ENCOUNTER — Other Ambulatory Visit: Payer: Self-pay

## 2019-07-15 ENCOUNTER — Ambulatory Visit (INDEPENDENT_AMBULATORY_CARE_PROVIDER_SITE_OTHER): Payer: PRIVATE HEALTH INSURANCE | Admitting: Orthopaedic Surgery

## 2019-07-15 ENCOUNTER — Encounter: Payer: Self-pay | Admitting: Orthopaedic Surgery

## 2019-07-15 DIAGNOSIS — M25551 Pain in right hip: Secondary | ICD-10-CM

## 2019-07-15 DIAGNOSIS — M25559 Pain in unspecified hip: Secondary | ICD-10-CM

## 2019-07-15 NOTE — Progress Notes (Signed)
The patient is being seen in follow-up after having an intra-articular steroid injection under ultrasound by Dr. Prince Rome in his right hip.  He says the injection did not really help a whole lot for him.  He still having pain in the groin.  He has had hernia surgery on that side and a CT scan did not show any complicating features of the hernia or the mesh.  He still has pain in the groin with pivoting activities.  The ultrasound showed potentially a tear of the labrum but does not have specific.  On exam he still does have some pain in the groin with extremes of rotation of his right hip.  It is uncomfortable to him but not debilitating.  However, he would like to get an idea why this is happening and continue to give him problems.  At this point I have recommended a MRI arthrogram of the right hip to assess the labrum as well as cartilage and other structures around the hip to see if we can get an idea of what is causing his symptoms.  We will work on getting that scheduled.  All question concerns were answered and addressed.  We will see him in follow-up to go over the study.

## 2019-08-06 ENCOUNTER — Other Ambulatory Visit: Payer: Self-pay | Admitting: Orthopaedic Surgery

## 2019-08-11 ENCOUNTER — Other Ambulatory Visit: Payer: Self-pay

## 2019-08-11 ENCOUNTER — Ambulatory Visit
Admission: RE | Admit: 2019-08-11 | Discharge: 2019-08-11 | Disposition: A | Discharge status: Home / Self Care | Payer: PRIVATE HEALTH INSURANCE | Source: Ambulatory Visit | Attending: Orthopaedic Surgery | Admitting: Orthopaedic Surgery

## 2019-08-11 DIAGNOSIS — M25559 Pain in unspecified hip: Secondary | ICD-10-CM

## 2019-08-11 MED ORDER — IOPAMIDOL (ISOVUE-M 200) INJECTION 41%
20.0000 mL | Freq: Once | INTRAMUSCULAR | Status: AC
  Administered 2019-08-11: 20 mL via INTRA_ARTICULAR

## 2019-08-18 ENCOUNTER — Encounter: Payer: Self-pay | Admitting: Orthopaedic Surgery

## 2019-08-18 ENCOUNTER — Ambulatory Visit (INDEPENDENT_AMBULATORY_CARE_PROVIDER_SITE_OTHER): Payer: PRIVATE HEALTH INSURANCE | Admitting: Orthopaedic Surgery

## 2019-08-18 DIAGNOSIS — M25551 Pain in right hip: Secondary | ICD-10-CM | POA: Diagnosis not present

## 2019-08-18 NOTE — Progress Notes (Signed)
Mr. Duddy comes in today to go over an MRI of his right hip.  Was sent for an MR arthrogram based on his clinical exam findings and thorough conservative treatment we were concerned about a labral tear.  He has pains on extremes of rotation of his right hip and some sports activities cause pain to flareup.  Is not every day and it comes and goes.  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  The femoral head is normal with no cartilage irregularity at all.  There is some slight subchondral edema in the roof of the acetabulum as well showing stress changes.  I showed him a hip model explained what this means.  I recommend activity modification for now and low impact sports types of activities.  My neck step would be to send him to Dr. Prince Rome for either an ultrasound guided intra-articular steroid injection in the right hip joint versus even considering PRP.  I gave him information about that.  All questions and concerns were answered and addressed.  Follow-up as otherwise as needed.

## 2019-08-19 ENCOUNTER — Telehealth: Payer: Self-pay | Admitting: Orthopaedic Surgery

## 2019-08-19 NOTE — Telephone Encounter (Signed)
I am not sure what he is talking about.  See when she can find out.  There was nothing else that I needed to check on.  I did let him know that if he wanted to try an intra-articular steroid injection to let us know that we would have that set up with Dr. Prince Rome for ultrasound-guided steroid injection in his right hip.  Maybe that is what he is talking about.

## 2019-08-19 NOTE — Telephone Encounter (Signed)
Patient called.   Justin Cobb he was told to check in with an update after the time of his last appointment.   Call back: [phone]

## 2019-08-20 NOTE — Telephone Encounter (Signed)
Patient was wondering if you could possibly call him and discuss the PRP alittle?

## 2019-08-21 NOTE — Telephone Encounter (Signed)
If he signs up for MyChart, I can send some info.  Otherwise schedule is a bit crazy so I may not have time to call.

## 2019-08-24 NOTE — Telephone Encounter (Signed)
Can you get him set up on MyChart so Hilts can send him some info?

## 2019-09-07 ENCOUNTER — Other Ambulatory Visit: Payer: Self-pay

## 2019-09-07 ENCOUNTER — Encounter: Payer: Self-pay | Admitting: Family Medicine

## 2019-09-07 ENCOUNTER — Ambulatory Visit (INDEPENDENT_AMBULATORY_CARE_PROVIDER_SITE_OTHER): Payer: PRIVATE HEALTH INSURANCE | Admitting: Family Medicine

## 2019-09-07 VITALS — Ht 71.0 in | Wt 187.0 lb

## 2019-09-07 DIAGNOSIS — M25551 Pain in right hip: Secondary | ICD-10-CM | POA: Diagnosis not present

## 2019-09-07 NOTE — Patient Instructions (Signed)
    Hip Treatment:  - We'll try physical therapy.  - Add Turmeric 500 mg twice daily.  Future options:  - Dextrose prolotherapy injection.  - PRP (platelet-rich plasma) injection.

## 2019-09-07 NOTE — Progress Notes (Signed)
   Office Visit Note   Patient: Justin Cobb           Date of Birth: 03/15/1985           MRN: [id number] Visit Date: 09/07/2019 Requested by: [place] [address] [place],  [place] [address] PCP: Tarri Fuller, MD  Subjective: Chief Complaint  Patient presents with  . Right Hip - Pain    Consultation from Dr.Blackman for right hip ultrasound guided injection versus PRP    HPI: He is here to discuss treatment options for his right hip.  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  He would like to avoid surgery for as long as possible given his young age.  He has given up running but he still likes snowboarding in the wintertime.  The injection I gave him did not give much relief, even in the anesthetic phase.  He wondered whether there are any treatments that might regrow cartilage.               ROS:   All other systems were reviewed and are negative.  Objective: Vital Signs: Ht 5\' 11"  (1.803 m)   Wt 187 lb (84.8 kg)   BMI 26.08 kg/m   Physical Exam:  General:  Alert and oriented, in no acute distress. Pulm:  Breathing unlabored. Psy:  Normal mood, congruent affect.  Right hip: He still has pretty good range of motion with internal rotation but he does have pain.  Imaging: No results found.  Assessment & Plan: 1.  Right hip DJD -He recently started taking glucosamine chondroitin.  He will add turmeric.  We will try physical therapy for strengthening and possibly traction. -If his pain doesn't improve we will contemplate dextrose prolotherapy.  If that doesn't help, then PRP.     Procedures: No procedures performed  No notes on file     PMFS History: There are no problems to display for this patient.  Past Medical History:  Diagnosis Date  . History of tooth extraction     History reviewed. No pertinent family history.  Past Surgical History:  Procedure Laterality Date  .  FRACTURE SURGERY     Wrist  . HERNIA REPAIR    . INGUINAL HERNIA REPAIR Right 11/02/2016   Procedure: LAPAROSCOPIC RIGHT INGUINAL HERNIA WITH MESH;  Surgeon: 11/04/2016, MD;  Location: [facility];  Service: General;  Laterality: Right;   Social History   Occupational History  . Not on file  Tobacco Use  . Smoking status: Never Smoker  . Smokeless tobacco: Never Used  Vaping Use  . Vaping Use: Never used  Substance and Sexual Activity  . Alcohol use: Yes    Comment: occas  . Drug use: No  . Sexual activity: Not on file

## 2021-03-03 IMAGING — XA DG FLUORO GUIDE NDL PLC/BX
2 series · 2 of 2 positions shown · non-contrast
Comparison: none

CLINICAL DATA: Hip pain.  Question labral tear.

[Series 1: ortho adipose · 1 of 1 slices shown (1 of 2)]
[im 1/1]
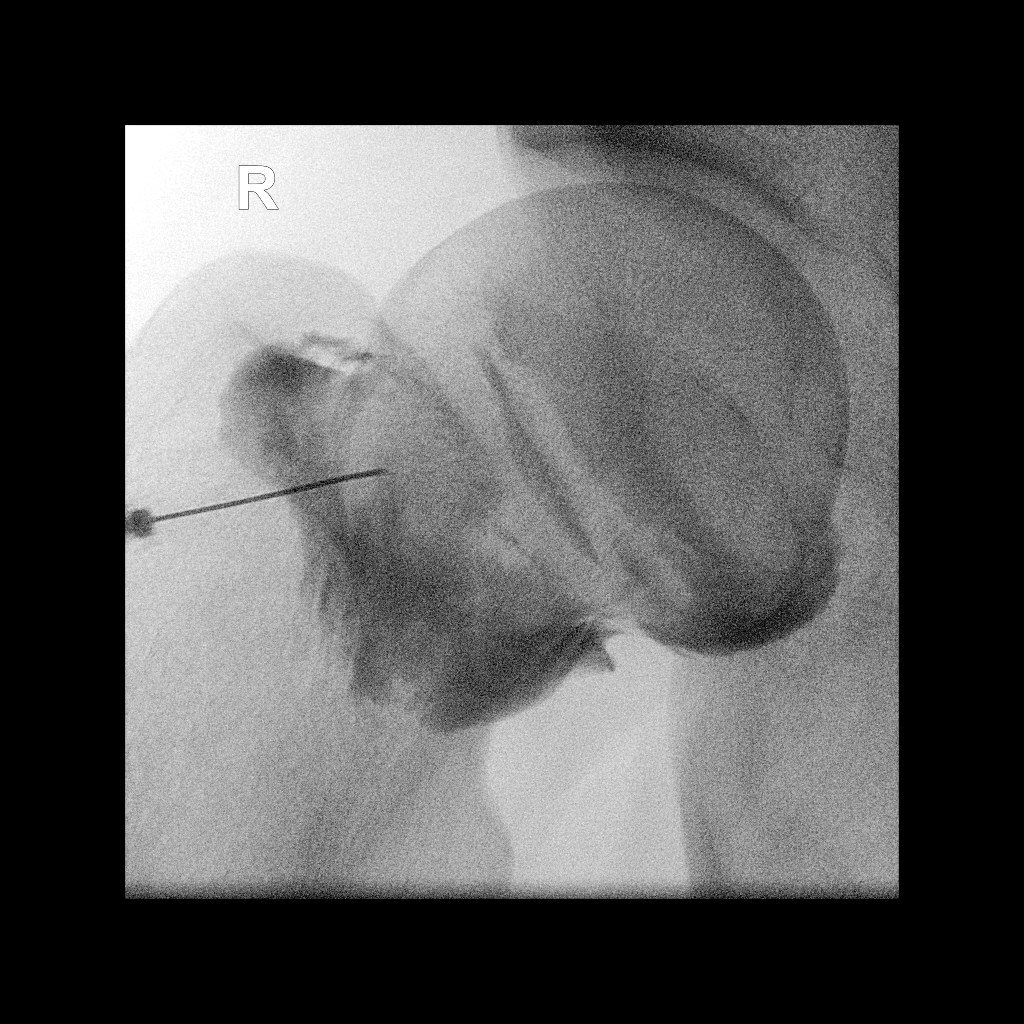

[Series 2: ortho adipose · 1 of 1 slices shown (2 of 2)]
[im 1/1]
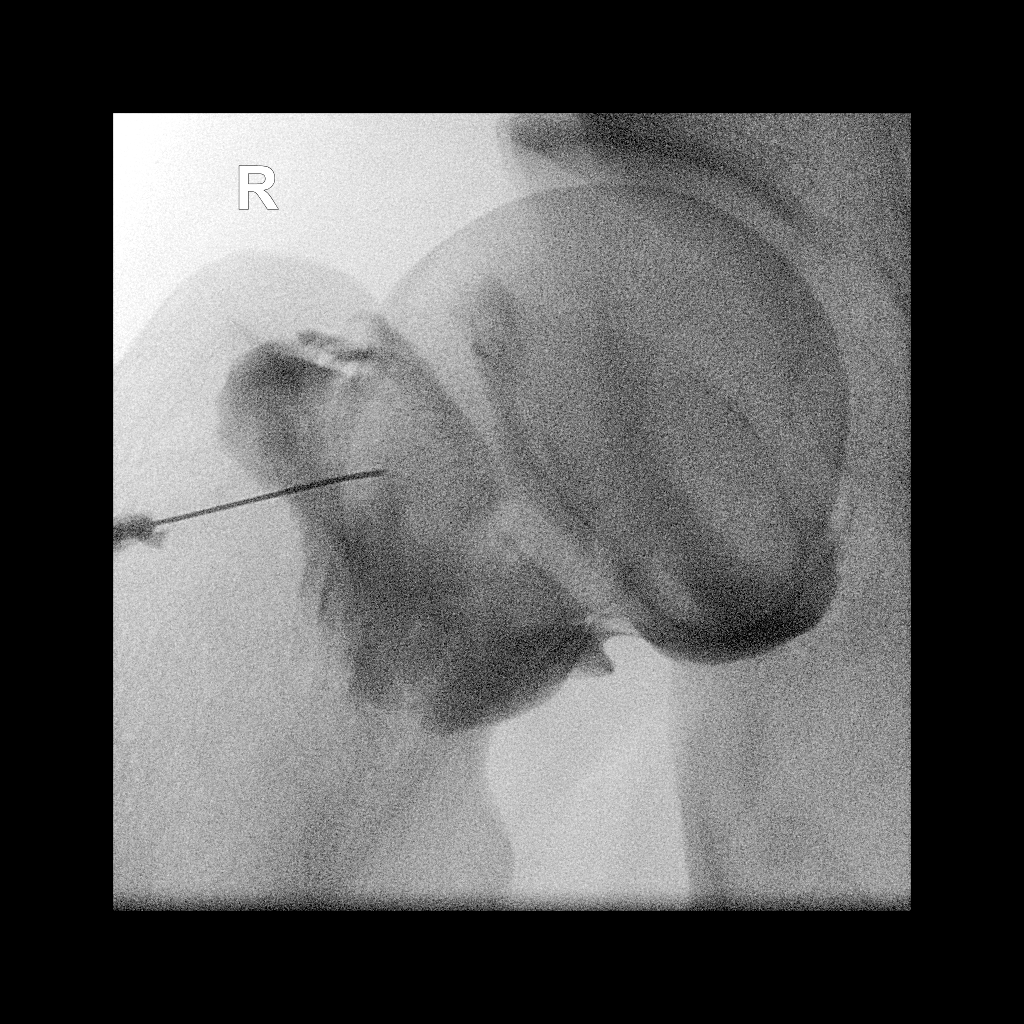

[2 of 2 positions shown; findings below may reference images not displayed]

FLUOROSCOPY TIME:  0 minutes 21 seconds. 30.32 micro gray meter
squared

PROCEDURE:
Right hip INJECTION UNDER FLUOROSCOPY

The skin was scrubbed with Betadine and draped in sterile fashion.
Local anesthesia was carried [DATE]% lidocaine. 22 gauge spinal
needle was directed into the hip joint. 12 cc of a mixture of 20 cc
dilute Isovue 200 mixed with 0.1 cc MultiHance was used to fill the
hip joint. No complication was evident. Patient taken to MRI in good
condition.
IMPRESSION: Technically successful right hip injection for MRI.
